# Patient Record
Sex: Male | Born: 1995 | Race: Black or African American | Hispanic: No | Marital: Married | Smoking: Former smoker
Health system: Southern US, Community
[De-identification: ages and names within clinical notes are randomized; demographics above are authoritative.]

## PROBLEM LIST (undated history)

## (undated) DIAGNOSIS — F419 Anxiety disorder, unspecified: Secondary | ICD-10-CM

## (undated) DIAGNOSIS — Q5310 Unspecified undescended testicle, unilateral: Secondary | ICD-10-CM

## (undated) HISTORY — PX: HERNIA REPAIR: SHX51

## (undated) HISTORY — DX: Anxiety disorder, unspecified: F41.9

## (undated) HISTORY — PX: DENTAL SURGERY: SHX609

---
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
## 2021-07-12 ENCOUNTER — Encounter: Payer: Self-pay | Admitting: Emergency Medicine

## 2021-07-12 ENCOUNTER — Other Ambulatory Visit: Payer: Self-pay

## 2021-07-12 ENCOUNTER — Ambulatory Visit
Admission: EM | Admit: 2021-07-12 | Discharge: 2021-07-12 | Disposition: A | Discharge status: Home / Self Care | Payer: Self-pay

## 2021-07-12 DIAGNOSIS — R0789 Other chest pain: Secondary | ICD-10-CM
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
## 2023-06-10 ENCOUNTER — Ambulatory Visit (INDEPENDENT_AMBULATORY_CARE_PROVIDER_SITE_OTHER): Payer: BC Managed Care – PPO | Admitting: Nurse Practitioner

## 2023-06-10 ENCOUNTER — Encounter: Payer: Self-pay | Admitting: Nurse Practitioner

## 2023-06-10 VITALS — BP 120/90 | HR 60 | Temp 99.1°F | Ht 73.0 in | Wt 280.6 lb

## 2023-06-10 DIAGNOSIS — E66812 Obesity, class 2: Secondary | ICD-10-CM

## 2023-06-10 DIAGNOSIS — R030 Elevated blood-pressure reading, without diagnosis of hypertension: Secondary | ICD-10-CM

## 2023-06-10 DIAGNOSIS — R2689 Other abnormalities of gait and mobility: Secondary | ICD-10-CM | POA: Diagnosis not present

## 2023-06-10 DIAGNOSIS — Z7689 Persons encountering health services in other specified circumstances: Secondary | ICD-10-CM

## 2023-06-10 DIAGNOSIS — Z23 Encounter for immunization: Secondary | ICD-10-CM | POA: Diagnosis not present

## 2023-06-10 DIAGNOSIS — Z6837 Body mass index (BMI) 37.0-37.9, adult: Secondary | ICD-10-CM

## 2023-06-10 DIAGNOSIS — H6123 Impacted cerumen, bilateral: Secondary | ICD-10-CM

## 2023-06-10 DIAGNOSIS — R4589 Other symptoms and signs involving emotional state: Secondary | ICD-10-CM

## 2023-06-10 DIAGNOSIS — E6609 Other obesity due to excess calories: Secondary | ICD-10-CM

## 2023-06-10 DIAGNOSIS — Z13228 Encounter for screening for other metabolic disorders: Secondary | ICD-10-CM

## 2023-06-10 DIAGNOSIS — Z2821 Immunization not carried out because of patient refusal: Secondary | ICD-10-CM

## 2023-06-10 MED ORDER — MAGNESIUM 250 MG PO TABS: 1.0000 | ORAL_TABLET | Freq: Every day | ORAL | 1 refills | Status: AC

## 2023-06-10 NOTE — Patient Instructions (Signed)
You can use 1/2 water and 1/2 peroxide to ear when feeling full or you can use over the counter Debrox drops 1-2 times a week. Increase your intake of omega 3 - fish.

## 2023-06-10 NOTE — Progress Notes (Signed)
Madelaine Bhat, CMA,acting as a Neurosurgeon for Arnette Felts, FNP.,have documented all relevant documentation on the behalf of Arnette Felts, FNP,as directed by  Arnette Felts, FNP while in the presence of Arnette Felts, FNP.  Subjective:  Patient ID: Howard Stanley , male    DOB: Jul 16, 1995 , 28 y.o.   MRN: [id number]  No chief complaint on file.   HPI  Patient presents today to establish care.  He has not seen a PCP since a chil. Works for [employer] in [place]. Married, no children.  He was referred by his brother in law. Highest level of education graduated high school.   History of asthma as a child - no medications.   Does not talk with his mother and does not have a relationship with his father.  He will feel heavy in the chest. Was going to therapy but stopped about 8 months. It does not affect him to do work.   Patient reports compliance with medication. Patient denies any chest pain, SOB, or headaches. Patient reports about 3 times a weeks he gets off balance but its only for a second but he is not dizzy or lightheaded.      Past Medical History:  Diagnosis Date   Anxiety I do not know   Undescended right testes      Family History  Problem Relation Age of Onset   Diabetes Brother      Current Outpatient Medications:    Magnesium 250 MG TABS, Take 1 tablet (250 mg total) by mouth daily., Disp: 90 tablet, Rfl: 1   No Known Allergies   Review of Systems  Constitutional: Negative.   HENT: Negative.    Eyes: Negative.   Respiratory: Negative.    Cardiovascular: Negative.   Gastrointestinal: Negative.   Endocrine: Negative.   Genitourinary: Negative.   Musculoskeletal: Negative.   Allergic/Immunologic: Negative.   Neurological: Negative.        Feels off balance when he is at work, mostly when moving something and quickly resolves.   Hematological: Negative.   Psychiatric/Behavioral: Negative.       Today's Vitals   06/10/23 1127 06/10/23 1213  BP: (!)  140/90 (!) 120/90  Pulse: 60   Temp: 99.1 F (37.3 C)   TempSrc: Oral   Weight: 280 lb 9.6 oz (127.3 kg)   Height: 6\' 1"  (1.854 m)   PainSc: 0-No pain    Body mass index is 37.02 kg/m.  Wt Readings from Last 3 Encounters:  06/10/23 280 lb 9.6 oz (127.3 kg)  04/17/11 155 lb (70.3 kg) (70%, Z= 0.53)*   * Growth percentiles are based on CDC (Boys, 2-20 Years) data.     Objective:  Physical Exam Vitals reviewed.  Constitutional:      General: He is not in acute distress.    Appearance: Normal appearance. He is obese.  HENT:     Head: Normocephalic.     Right Ear: External ear normal. There is impacted cerumen.     Left Ear: External ear normal. There is impacted cerumen.  Cardiovascular:     Rate and Rhythm: Normal rate and regular rhythm.     Pulses: Normal pulses.     Heart sounds: Normal heart sounds. No murmur heard. Pulmonary:     Effort: Pulmonary effort is normal. No respiratory distress.     Breath sounds: Normal breath sounds. No wheezing.  Musculoskeletal:        General: Normal range of motion.  Skin:  General: Skin is warm and dry.     Capillary Refill: Capillary refill takes less than 2 seconds.  Neurological:     General: No focal deficit present.     Mental Status: He is alert and oriented to person, place, and time.     Cranial Nerves: No cranial nerve deficit.     Motor: Motor function is intact.     Coordination: Romberg sign negative.     Gait: Gait is intact.  Psychiatric:        Mood and Affect: Mood normal.        Behavior: Behavior normal.        Thought Content: Thought content normal.        Judgment: Judgment normal.        06/10/2023   11:36 AM  Depression screen PHQ 2/9  Decreased Interest 1  Down, Depressed, Hopeless 0  PHQ - 2 Score 1  Altered sleeping 0  Tired, decreased energy 1  Change in appetite 0  Feeling bad or failure about yourself  0  Trouble concentrating 0  Moving slowly or fidgety/restless 0  Suicidal thoughts  0  PHQ-9 Score 2  Difficult doing work/chores Not difficult at all      06/10/2023   11:36 AM  GAD 7 : Generalized Anxiety Score  Nervous, Anxious, on Edge 1  Control/stop worrying 0  Worry too much - different things 1  Trouble relaxing 0  Restless 0  Easily annoyed or irritable 0  Afraid - awful might happen 0  Total GAD 7 Score 2  Anxiety Difficulty Not difficult at all        Assessment And Plan:  Encounter for screening for metabolic disorder -     Hemoglobin A1c  Feeling anxious Assessment & Plan: He is to take magnesium supplement. Will also check metabolic labs   Bilateral impacted cerumen Assessment & Plan: Wax is removed by with lavage with elephant ear with 1/2 water and 1/2 peroxide. Instructions for home care to prevent wax buildup are given.   Orders: -     Ear Lavage  Balance problem Assessment & Plan: This may be related to his large amount of cerumen impaction bilateral ears.   Orders: -     CBC with Differential/Platelet -     CMP14+EGFR -     TSH  Elevated BP without diagnosis of hypertension Assessment & Plan: This is his first visit in the office, will have him to return for f/u. He is also to take magnesium supplement and advised to cut back on salt intake.    Influenza vaccination declined Assessment & Plan: Patient declined influenza vaccination at this time. Patient is aware that influenza vaccine prevents illness in 70% of healthy people, and reduces hospitalizations to 30-70% in elderly. This vaccine is recommended annually. Education has been provided regarding the importance of this vaccine but patient still declined. Advised may receive this vaccine at local pharmacy or Health Dept.or vaccine clinic. Aware to provide a copy of the vaccination record if obtained from local pharmacy or Health Dept.  Pt is willing to accept risk associated with refusing vaccination.    COVID-19 vaccination declined Assessment & Plan: Declines covid  19 vaccine. Discussed risk of covid 2 and if he changes her mind about the vaccine to call the office. Education has been provided regarding the importance of this vaccine but patient still declined. Advised may receive this vaccine at local pharmacy or Health Dept.or vaccine clinic. Aware to  provide a copy of the vaccination record if obtained from local pharmacy or Health Dept.  Encouraged to take multivitamin, vitamin d, vitamin c and zinc to increase immune system. Aware can call office if would like to have vaccine here at office. Verbalized acceptance and understanding.    Need for Tdap vaccination Assessment & Plan: Will give tetanus vaccine today while in office. Refer to order management. TDAP will be administered to adults 68-17 years old every 10 years.   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 Advised to aim for at least 150 minutes of exercise per week.    Establishing care with new doctor, encounter for Assessment & Plan: Behavior modifications discussed and diet history reviewed.   Pt will continue to exercise regularly and modify diet with low GI, plant based foods and decrease intake of processed foods.  Recommend intake of daily multivitamin, Vitamin D, and calcium.  Recommend for preventive screenings, as well as recommend immunizations that include influenza, TDAP    Other orders -     Magnesium; Take 1 tablet (250 mg total) by mouth daily.  Dispense: 90 tablet; Refill: 1    Return in about 4 months (around 10/08/2023) for phy when able.  Patient was given opportunity to ask questions. Patient verbalized understanding of the plan and was able to repeat key elements of the plan. All questions were answered to their  satisfaction.    Jeanell Sparrow, FNP, have reviewed all documentation for this visit. The documentation on 06/10/23 for the exam, diagnosis, procedures, and orders are all accurate and complete.   IF YOU HAVE BEEN REFERRED TO A SPECIALIST, IT MAY TAKE 1-2 WEEKS TO SCHEDULE/PROCESS THE REFERRAL. IF YOU HAVE NOT HEARD FROM US/SPECIALIST IN TWO WEEKS, PLEASE GIVE [place] A CALL AT [phone].

## 2023-06-11 LAB — CBC WITH DIFFERENTIAL/PLATELET
Basophils Absolute: 0 10*3/uL (ref 0.0–0.2)
Basos: 1 %
EOS (ABSOLUTE): 0.1 10*3/uL (ref 0.0–0.4)
Eos: 2 %
Hematocrit: 46.3 % (ref 37.5–51.0)
Hemoglobin: 15.6 g/dL (ref 13.0–17.7)
Immature Grans (Abs): 0 10*3/uL (ref 0.0–0.1)
Immature Granulocytes: 0 %
Lymphocytes Absolute: 1.4 10*3/uL (ref 0.7–3.1)
Lymphs: 41 %
MCH: 28.3 pg (ref 26.6–33.0)
MCHC: 33.7 g/dL (ref 31.5–35.7)
MCV: 84 fL (ref 79–97)
Monocytes Absolute: 0.3 10*3/uL (ref 0.1–0.9)
Monocytes: 7 %
Neutrophils Absolute: 1.7 10*3/uL (ref 1.4–7.0)
Neutrophils: 49 %
Platelets: 273 10*3/uL (ref 150–450)
RBC: 5.52 x10E6/uL (ref 4.14–5.80)
RDW: 13.1 % (ref 11.6–15.4)
WBC: 3.4 10*3/uL (ref 3.4–10.8)

## 2023-06-11 LAB — CMP14+EGFR
ALT: 30 [IU]/L (ref 0–44)
AST: 26 [IU]/L (ref 0–40)
Albumin: 4.8 g/dL (ref 4.3–5.2)
Alkaline Phosphatase: 69 [IU]/L (ref 44–121)
BUN/Creatinine Ratio: 11 (ref 9–20)
BUN: 14 mg/dL (ref 6–20)
Bilirubin Total: 0.8 mg/dL (ref 0.0–1.2)
CO2: 22 mmol/L (ref 20–29)
Calcium: 10.1 mg/dL (ref 8.7–10.2)
Chloride: 101 mmol/L (ref 96–106)
Creatinine, Ser: 1.26 mg/dL (ref 0.76–1.27)
Globulin, Total: 2.9 g/dL (ref 1.5–4.5)
Glucose: 94 mg/dL (ref 70–99)
Potassium: 4.5 mmol/L (ref 3.5–5.2)
Sodium: 138 mmol/L (ref 134–144)
Total Protein: 7.7 g/dL (ref 6.0–8.5)
eGFR: 79 mL/min/{1.73_m2} (ref >59)

## 2023-06-11 LAB — HEMOGLOBIN A1C
Est. average glucose Bld gHb Est-mCnc: 117 mg/dL
Hgb A1c MFr Bld: 5.7 % — ABNORMAL HIGH (ref 4.8–5.6)

## 2023-06-11 LAB — TSH: TSH: 1.93 u[IU]/mL (ref 0.450–4.500)

## 2023-06-13 ENCOUNTER — Encounter: Payer: Self-pay | Admitting: Nurse Practitioner

## 2023-06-13 DIAGNOSIS — Z2821 Immunization not carried out because of patient refusal: Secondary | ICD-10-CM | POA: Insufficient documentation

## 2023-06-13 DIAGNOSIS — Z23 Encounter for immunization: Secondary | ICD-10-CM | POA: Insufficient documentation

## 2023-06-13 DIAGNOSIS — Z7689 Persons encountering health services in other specified circumstances: Secondary | ICD-10-CM | POA: Insufficient documentation

## 2023-06-13 NOTE — Assessment & Plan Note (Signed)

## 2023-06-13 NOTE — Assessment & Plan Note (Signed)
 he is encouraged to strive for BMI less than 30 to decrease cardiac risk. Advised to aim for at least 150 minutes of exercise per week.

## 2023-06-13 NOTE — Assessment & Plan Note (Signed)
This is his first visit in the office, will have him to return for f/u. He is also to take magnesium supplement and advised to cut back on salt intake.

## 2023-06-13 NOTE — Assessment & Plan Note (Signed)
 Will give tetanus vaccine today while in office. Refer to order management. TDAP will be administered to adults 44-29 years old every 10 years.

## 2023-06-13 NOTE — Assessment & Plan Note (Signed)
 Declines covid 19 vaccine. Discussed risk of covid 64 and if he changes her mind about the vaccine to call the office. Education has been provided regarding the importance of this vaccine but patient still declined. Advised may receive this vaccine at local pharmacy or Health Dept.or vaccine clinic. Aware to provide a copy of the vaccination record if obtained from local pharmacy or Health Dept.  Encouraged to take multivitamin, vitamin d, vitamin c and zinc to increase immune system. Aware can call office if would like to have vaccine here at office. Verbalized acceptance and understanding.

## 2023-06-13 NOTE — Assessment & Plan Note (Signed)
He is to take magnesium supplement. Will also check metabolic labs

## 2023-06-13 NOTE — Assessment & Plan Note (Signed)
 Wax is removed by with lavage with elephant ear with 1/2 water and 1/2 peroxide. Instructions for home care to prevent wax buildup are given.

## 2023-06-13 NOTE — Assessment & Plan Note (Signed)
This may be related to his large amount of cerumen impaction bilateral ears.

## 2023-06-13 NOTE — Assessment & Plan Note (Signed)
 Behavior modifications discussed and diet history reviewed.   Pt will continue to exercise regularly and modify diet with low GI, plant based foods and decrease intake of processed foods.  Recommend intake of daily multivitamin, Vitamin D, and calcium.  Recommend for preventive screenings, as well as recommend immunizations that include influenza, TDAP

## 2023-11-03 ENCOUNTER — Encounter: Payer: BC Managed Care – PPO | Admitting: Nurse Practitioner
